# Patient Record
Sex: Male | Born: 1955 | Race: Black or African American | Hispanic: No | Marital: Single | Smoking: Former smoker
Health system: Southern US, Community
[De-identification: ages and names within clinical notes are randomized; demographics above are authoritative.]

## PROBLEM LIST (undated history)

## (undated) DIAGNOSIS — I10 Essential (primary) hypertension: Secondary | ICD-10-CM

## (undated) DIAGNOSIS — E785 Hyperlipidemia, unspecified: Secondary | ICD-10-CM

## (undated) DIAGNOSIS — J45909 Unspecified asthma, uncomplicated: Secondary | ICD-10-CM

## (undated) HISTORY — DX: Essential (primary) hypertension: I10

## (undated) HISTORY — DX: Hyperlipidemia, unspecified: E78.5

## (undated) HISTORY — DX: Unspecified asthma, uncomplicated: J45.909

## (undated) HISTORY — PX: NO PAST SURGERIES: SHX2092

---

## 2007-11-10 ENCOUNTER — Emergency Department (HOSPITAL_COMMUNITY): Admission: EM | Admit: 2007-11-10 | Discharge: 2007-11-10 | Payer: Self-pay | Admitting: Emergency Medicine

## 2008-01-20 ENCOUNTER — Ambulatory Visit: Payer: Self-pay | Admitting: Internal Medicine

## 2008-01-20 ENCOUNTER — Encounter (INDEPENDENT_AMBULATORY_CARE_PROVIDER_SITE_OTHER): Payer: Self-pay | Admitting: Family Medicine

## 2008-01-20 LAB — CONVERTED CEMR LAB
BUN: 9 mg/dL (ref 6–23)
Basophils Absolute: 0 10*3/uL (ref 0.0–0.1)
CO2: 22 meq/L (ref 19–32)
Calcium: 9.3 mg/dL (ref 8.4–10.5)
Chloride: 106 meq/L (ref 96–112)
Creatinine, Ser: 0.92 mg/dL (ref 0.40–1.50)
Eosinophils Relative: 14 % — ABNORMAL HIGH (ref 0–5)
Glucose, Bld: 80 mg/dL (ref 70–99)
HCT: 39.8 % (ref 39.0–52.0)
Hemoglobin: 13.3 g/dL (ref 13.0–17.0)
Lymphocytes Relative: 49 % — ABNORMAL HIGH (ref 12–46)
Lymphs Abs: 1.9 10*3/uL (ref 0.7–4.0)
Monocytes Absolute: 0.3 10*3/uL (ref 0.1–1.0)
Monocytes Relative: 9 % (ref 3–12)
RBC: 3.99 M/uL — ABNORMAL LOW (ref 4.22–5.81)
RDW: 13.6 % (ref 11.5–15.5)
Total Bilirubin: 0.5 mg/dL (ref 0.3–1.2)

## 2008-02-25 ENCOUNTER — Emergency Department (HOSPITAL_COMMUNITY): Admission: EM | Admit: 2008-02-25 | Discharge: 2008-02-25 | Payer: Self-pay | Admitting: Emergency Medicine

## 2008-09-05 ENCOUNTER — Ambulatory Visit: Payer: Self-pay | Admitting: Family Medicine

## 2008-09-14 ENCOUNTER — Encounter: Payer: Self-pay | Admitting: Internal Medicine

## 2008-09-14 ENCOUNTER — Ambulatory Visit (HOSPITAL_COMMUNITY): Admission: RE | Admit: 2008-09-14 | Discharge: 2008-09-14 | Payer: Self-pay | Admitting: Internal Medicine

## 2008-09-14 ENCOUNTER — Ambulatory Visit: Payer: Self-pay | Admitting: *Deleted

## 2009-11-25 IMAGING — CT CT HEAD W/O CM
1 series · 16 of 30 positions shown, 20 images · non-contrast
Comparison: None

CLINICAL DATA: Headache.  Right arm pain.

CT HEAD WITHOUT CONTRAST
TECHNIQUE: Contiguous axial images were obtained from the base of
the skull through the vertex without contrast.

[Series 2: head routine 4.8 h37s · axial · 0.43mm/px · z∈[-137,-8]mm · 16 of 30 slices shown, 20 images]
[im 2/30  brain]
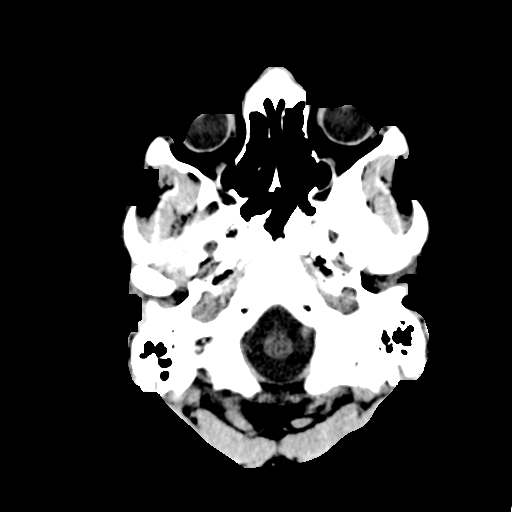
[im 2/30  bone]
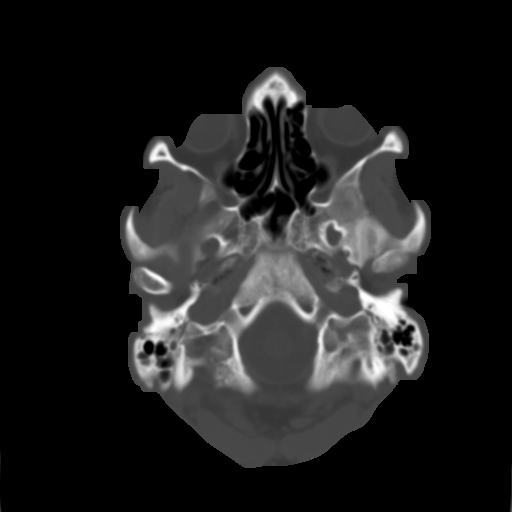
[im 4/30  brain]
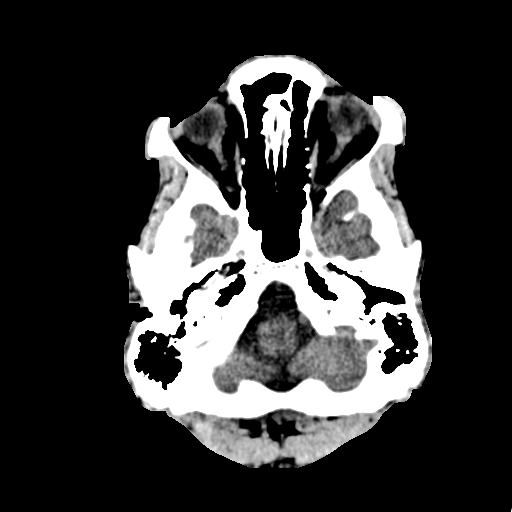
[im 6/30  brain]
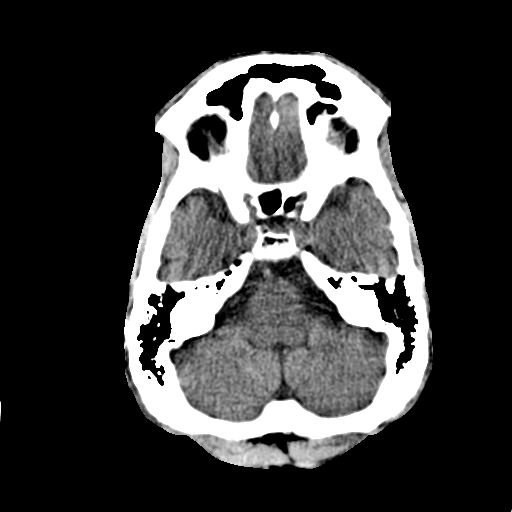
[im 8/30  brain]
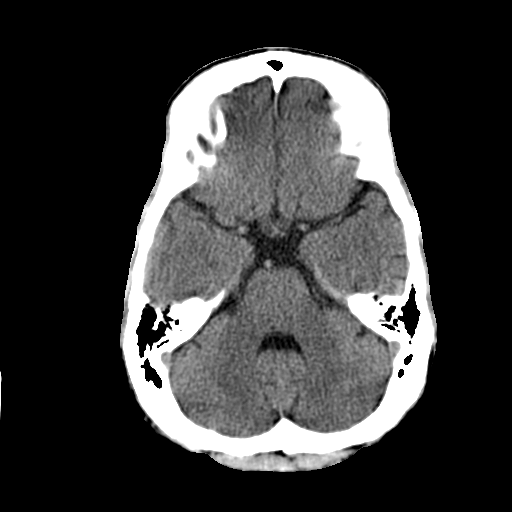
[im 9/30  brain]
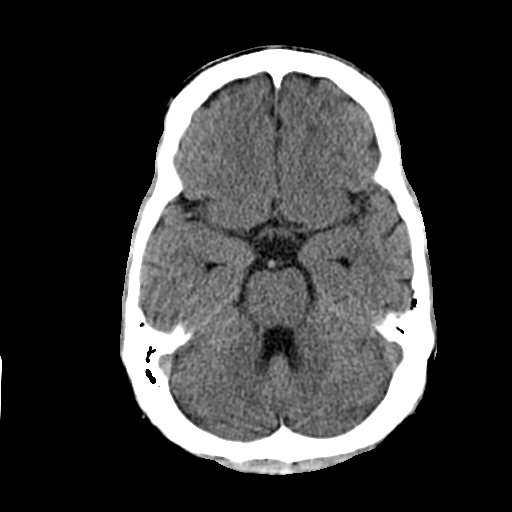
[im 9/30  bone]
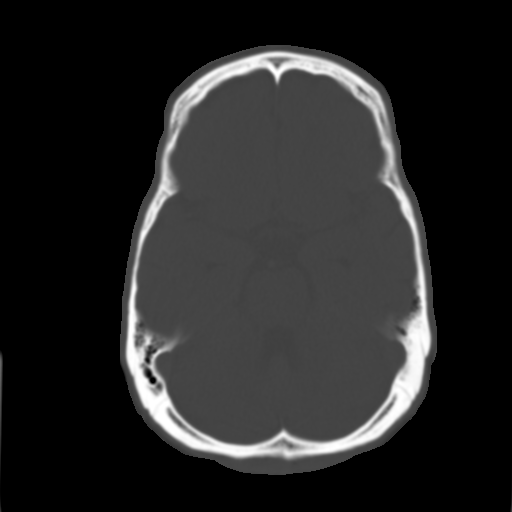
[im 11/30  brain]
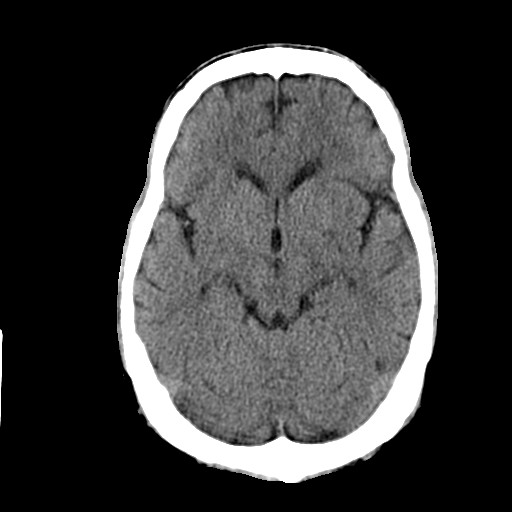
[im 13/30  brain]
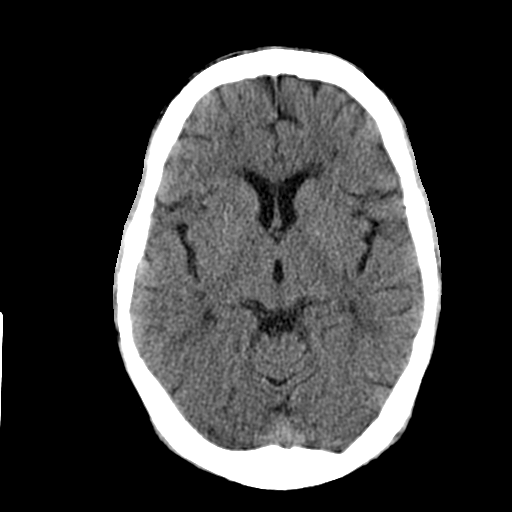
[im 15/30  brain]
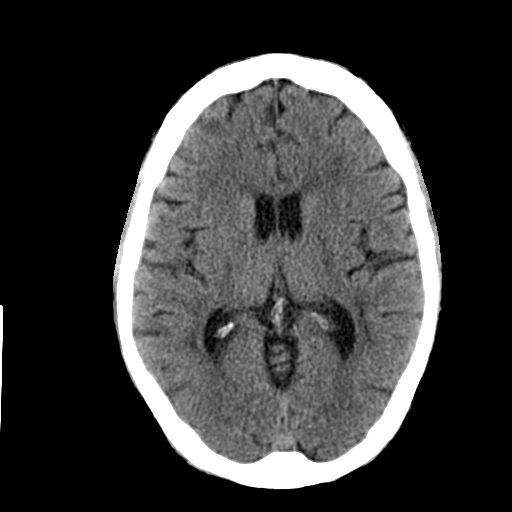
[im 16/30  brain]
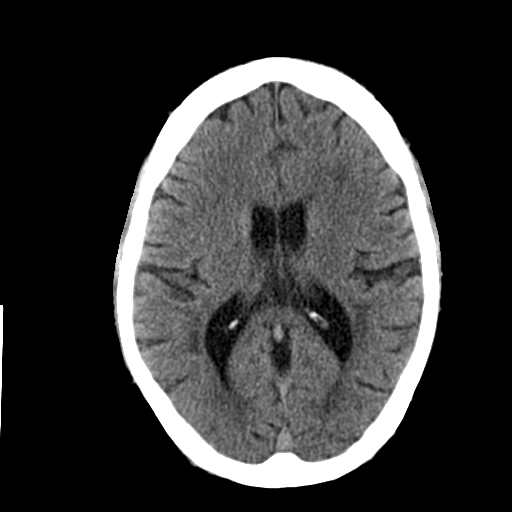
[im 16/30  bone]
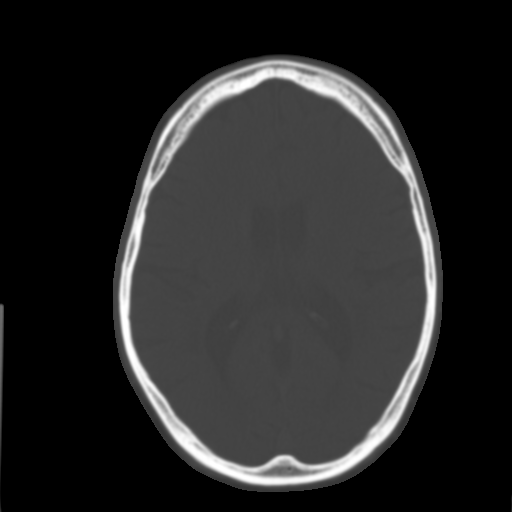
[im 18/30  brain]
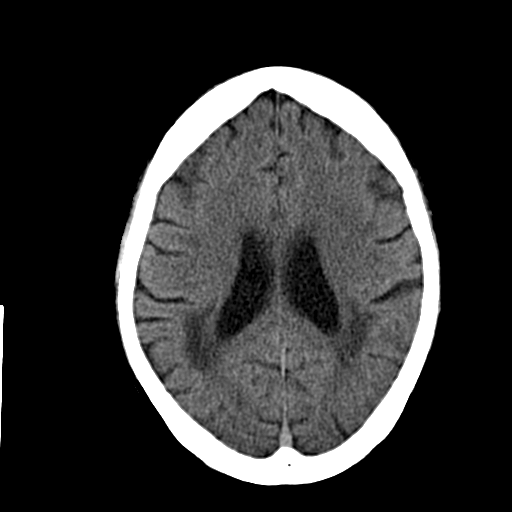
[im 20/30  brain]
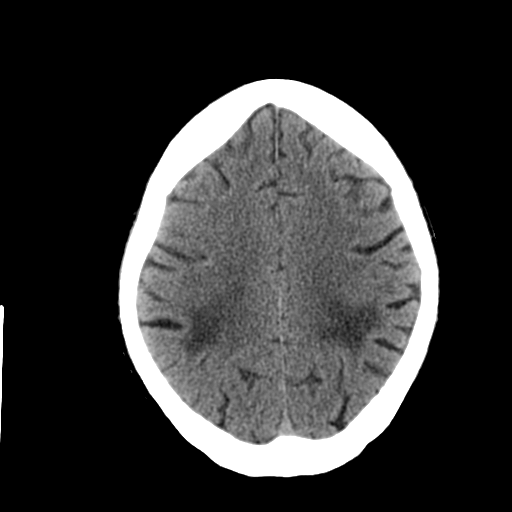
[im 22/30  brain]
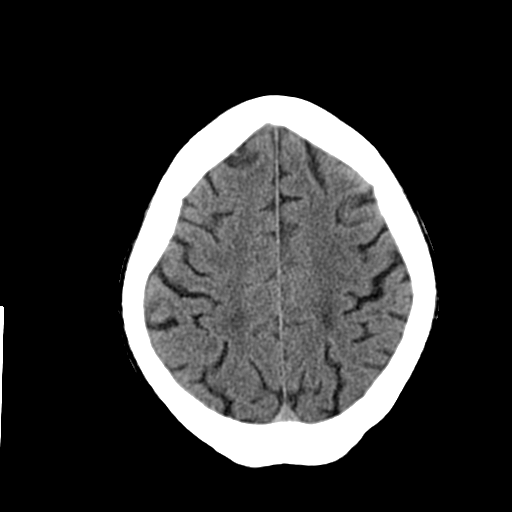
[im 23/30  brain]
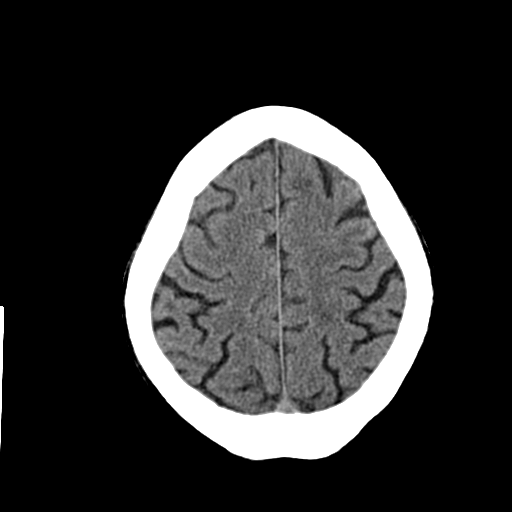
[im 23/30  bone]
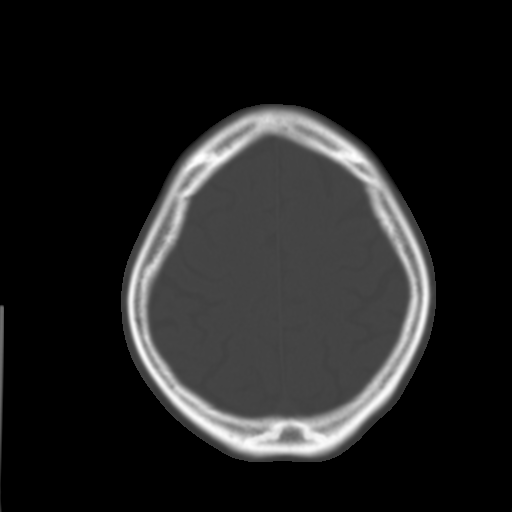
[im 25/30  brain]
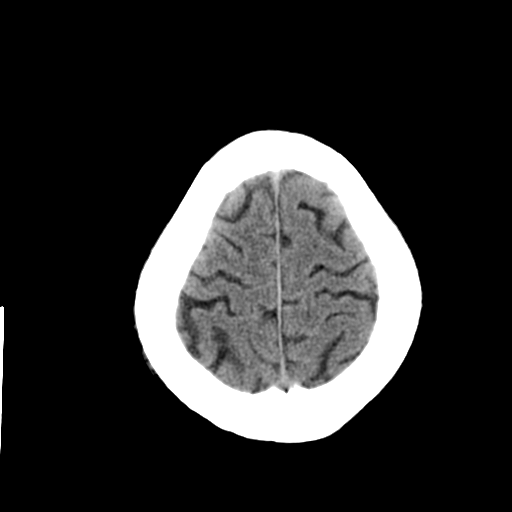
[im 27/30  brain]
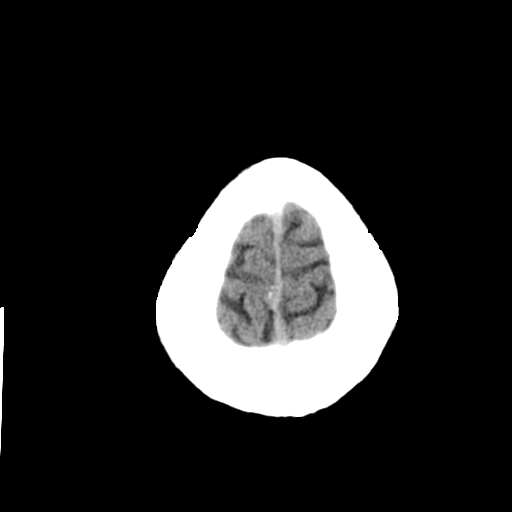
[im 29/30  brain]
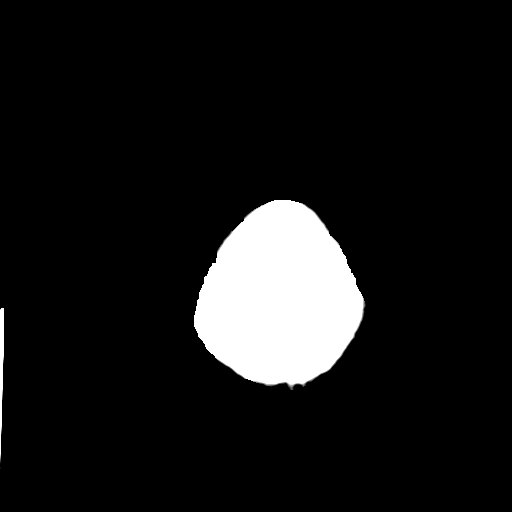

[16 of 30 positions shown; findings below may reference images not displayed]

FINDINGS: There is abnormal low density in the white matter of both
parietal lobes and to a lesser extent in the frontal lobes.  This
is presumed to represent chronic small vessel change, but is
certainly advanced for age.  No cortical abnormality is seen.  No
sign of mass lesion, hemorrhage, hydrocephalus or extra-axial
collection.  Sinuses, middle ears and mastoids are clear.
IMPRESSION: Extensive chronic small vessel changes.  No identifiably acute
insult.

## 2011-06-20 LAB — POCT CARDIAC MARKERS
CKMB, poc: 2.4
Myoglobin, poc: 59.8
Operator id: 146091
Troponin i, poc: <0.05

## 2011-06-20 LAB — POCT I-STAT, CHEM 8
Calcium, Ion: 1.23
Hemoglobin: 14.3
Sodium: 136
TCO2: 29

## 2012-06-08 ENCOUNTER — Other Ambulatory Visit (HOSPITAL_BASED_OUTPATIENT_CLINIC_OR_DEPARTMENT_OTHER): Payer: Self-pay | Admitting: Internal Medicine

## 2012-06-16 ENCOUNTER — Other Ambulatory Visit (HOSPITAL_BASED_OUTPATIENT_CLINIC_OR_DEPARTMENT_OTHER): Payer: Self-pay

## 2016-05-31 NOTE — Progress Notes (Deleted)
David Chase was seen today in neurologic consultation at the request of No primary care provider on file..  The consultation is for the evaluation of multiple syncopal episodes.  The records that were made available to me were reviewed.  The patient is a 60 y.o. year old male with a history of alcohol use/abuse, chronic pain under the care of pain management, COPD, HTN, hyperlipidemia who has had 2 syncopal episodes within the last 2 months.  With the first, the patient ***and fell down the stairs.  There was ***loss of bladder/bowel control.  There was no shaking or tongue biting.  The second episode happened about a month ago now.  ***His PCP ordered an echo which is reported to be normal in No primary care provider on file. notes but I [name]'t have the actual echo.     Neuroimaging has *** previously been performed.  It *** available for my review today.  PREVIOUS MEDICATIONS: ***  ALLERGIES:  Allergies not on file  CURRENT MEDICATIONS:  No outpatient encounter prescriptions on file as of 06/03/2016.   No facility-administered encounter medications on file as of 06/03/2016.     PAST MEDICAL HISTORY:  No past medical history on file.  PAST SURGICAL HISTORY:  No past surgical history on file.  SOCIAL HISTORY:   Social History   Social History  . Marital status: Single    Spouse name: N/A  . Number of children: N/A  . Years of education: N/A   Occupational History  . Not on file.   Social History Main Topics  . Smoking status: Not on file  . Smokeless tobacco: Not on file  . Alcohol use Not on file  . Drug use: Unknown  . Sexual activity: Not on file   Other Topics Concern  . Not on file   Social History Narrative  . No narrative on file    FAMILY HISTORY:   No family status information on file.    ROS:  A complete 10 system review of systems was obtained and was unremarkable apart from what is mentioned above.  PHYSICAL EXAMINATION:    VITALS:  There were no  vitals filed for this visit.  GEN:  Normal appears male in no acute distress.  Appears stated age. HEENT:  Normocephalic, atraumatic. The mucous membranes are moist. The superficial temporal arteries are without ropiness or tenderness. Cardiovascular: Regular rate and rhythm. Lungs: Clear to auscultation bilaterally. Neck/Heme: There are no carotid bruits noted bilaterally.  NEUROLOGICAL: Orientation:  The patient is alert and oriented x 3.  Fund of knowledge is appropriate.  Recent and remote memory intact.  Attention span and concentration normal.  Repeats and names without difficulty. Cranial nerves: There is good facial symmetry. The pupils are equal round and reactive to light bilaterally. Fundoscopic exam reveals clear disc margins bilaterally. Extraocular muscles are intact and visual fields are full to confrontational testing. Speech is fluent and clear. Soft palate rises symmetrically and there is no tongue deviation. Hearing is intact to conversational tone. Tone: Tone is good throughout. Sensation: Sensation is intact to light touch and pinprick throughout (facial, trunk, extremities). Vibration is intact at the bilateral big toe. There is no extinction with double simultaneous stimulation. There is no sensory dermatomal level identified. Coordination:  The patient has no difficulty with RAM's or FNF bilaterally. Motor: Strength is 5/5 in the bilateral upper and lower extremities.  Shoulder shrug is equal and symmetric. There is no pronator drift.  There are no fasciculations  noted. DTR's: Deep tendon reflexes are 2/4 at the bilateral biceps, triceps, brachioradialis, patella and achilles.  Plantar responses are downgoing bilaterally. Gait and Station: The patient is able to ambulate without difficulty. The patient is able to heel toe walk without any difficulty. The patient is able to ambulate in a tandem fashion. The patient is able to stand in the Romberg position.  Labs: The patient  had lab work at his primary care physician on 02/29/2016 that had the opportunity to review.  His white blood cells were 3.2, hemoglobin 11.8, hematocrit 34.2 and platelets 212.  His AST was elevated at 61 and ALT was 33.  BUN was 6 and creatinine 0.86.  His hemoglobin A1c was 5.3.  TSH was normal at 1.200.   IMPRESSION/PLAN  1.  Syncope  -Does not sound like a primary neurologic event, and no history to suggest seizure, but we will certainly do an EEG.  If that is negative, he will have an ambulatory EEG.  -We will do an MRI of the brain  -Talked about the importance of tapering and ultimately discontinuing alcohol.   -Discussed [place] driving laws as it pertains to syncope of unknown origin.  He should not be driving for at least 6 months.  Discussed seizure/syncope and safety in detail which included but was not limited to no swimming alone, no tub baths without an unlocked door and someone else home, no working at heights.  No working with heavy or dangerous equipment.  2.  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   Cc:  No primary care provider on file.

## 2016-06-03 ENCOUNTER — Ambulatory Visit: Payer: Medicaid Other | Admitting: Neurology

## 2016-07-02 NOTE — Progress Notes (Signed)
David Chase was seen today in neurologic consultation at the request of OSEI-BONSU,GEORGE, MD.  The consultation is for the evaluation of multiple syncopal episodes.  The records that were made available to me were reviewed.  The patient is a 60 y.o. year old male with a history of alcohol use/abuse, chronic pain under the care of pain management, COPD, HTN, hyperlipidemia who has had 2 syncopal episodes within the last 2 months. Pt states that there may have 3.   With the first, the patient was walking down the stairs and had LOC and fell down the stairs.  No warning.  No palpitations.  No idea how long he was out but GF with him.  Doesn't know if lost bladder/bowel control There was no shaking or tongue biting.  The second episode happened about a month ago now.  He got up and walked in the hallway and fell and hit the floor.  He did have LOC. He had another similar episode about a week ago.  He cannot provide any details.   His PCP ordered an echo which is reported to be normal in PCP notes but I Rusell't have the actual echo.  No new meds/OTC cold meds.     Neuroimaging has previously been performed.  I have the report but not films.  He had a CT on 06/11/16 and it was nonacute and demonstrated mild atrophy.  PREVIOUS MEDICATIONS: n/a  ALLERGIES:  No Known Allergies  CURRENT MEDICATIONS:  Outpatient Encounter Prescriptions as of 07/04/2016  Medication Sig  . aspirin EC 325 MG tablet Take 325 mg by mouth daily.  . cyclobenzaprine (FLEXERIL) 5 MG tablet TAKE 1 TABLET BY MOUTH TWICE A DAY AS FOR 15 DAYS  . gabapentin (NEURONTIN) 300 MG capsule Take 300 mg by mouth 3 (three) times daily.  Marland Kitchen. PROAIR HFA 108 (90 Base) MCG/ACT inhaler USE 2 PUFFS 4 TIMES A DAY   No facility-administered encounter medications on file as of 07/04/2016.     PAST MEDICAL HISTORY:   Past Medical History:  Diagnosis Date  . Asthma   . Hyperlipidemia   . Hypertension     PAST SURGICAL HISTORY:   Past Surgical  History:  Procedure Laterality Date  . NO PAST SURGERIES      SOCIAL HISTORY:   Social History   Social History  . Marital status: Single    Spouse name: N/A  . Number of children: N/A  . Years of education: N/A   Occupational History  . 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 Smoking status: Former Smoker    Quit date: 07/04/2014  . Smokeless tobacco: Never Used  . Alcohol use Yes     Comment: "beers all day"; 2 -  40 oz of liquor  . Drug use:     Types: Marijuana  . Sexual activity: Not on file   Other Topics Concern  . Not on file   Social History Narrative  . No narrative on file    FAMILY HISTORY:   Family Status  Relation Status  . Mother Deceased  . Father Deceased  . Sister Alive  . Brother Alive  . Brother Deceased   one head trauma after fall; one "blacked out"    ROS:  Intermittent CP/SOB.  A complete 10 system review of systems was obtained and was unremarkable apart from what is mentioned above.  PHYSICAL EXAMINATION:    VITALS:   Vitals:  07/04/16 0926  BP: (!) 130/94  Pulse: 60  Weight: 176 lb (79.8 kg)  Height: 5\' 9"  (1.753 m)    GEN:  Normal appears male in no acute distress.  Appears stated age. HEENT:  Normocephalic, atraumatic. The mucous membranes are moist. The superficial temporal arteries are without ropiness or tenderness. Cardiovascular: Regular rate and rhythm. Lungs: Clear to auscultation bilaterally. Neck/Heme: There are no carotid bruits noted bilaterally.  NEUROLOGICAL: Orientation:  The patient is alert and oriented x 3.  Fund of knowledge is appropriate.  Recent and remote memory intact.  Attention span and concentration normal.  Repeats and names without difficulty. Cranial nerves: There is good facial symmetry. The pupils are equal round and reactive to light bilaterally. Fundoscopic exam reveals clear disc margins bilaterally. Extraocular muscles are intact and visual fields are full to  confrontational testing. Speech is fluent and clear. Soft palate rises symmetrically and there is no tongue deviation. Hearing is intact to conversational tone. Tone: Tone is good throughout. Sensation: Sensation is intact to light touch and pinprick throughout (facial, trunk, extremities). Vibration is intact at the bilateral big toe but slightly decreased. There is no extinction with double simultaneous stimulation. There is no sensory dermatomal level identified. Coordination:  The patient has no difficulty with RAM's or FNF bilaterally. Motor: Strength is 5/5 in the bilateral upper and lower extremities.  Shoulder shrug is equal and symmetric. There is no pronator drift.  There are no fasciculations noted. DTR's: Deep tendon reflexes are 3-3+/4 at the bilateral biceps, triceps, brachioradialis, patella and achilles.  Plantar responses are downgoing bilaterally. Gait and Station: The patient is able to ambulate without difficulty. The patient has trouble ambulating in a tandem fashion. The patient is able to heel/toe walk. The patient is able to stand in the Romberg position.  Labs: The patient had lab work at his primary care physician on 02/29/2016 that had the opportunity to review.  His white blood cells were 3.2, hemoglobin 11.8, hematocrit 34.2 and platelets 212.  His AST was elevated at 61 and ALT was 33.  BUN was 6 and creatinine 0.86.  His hemoglobin A1c was 5.3.  TSH was normal at 1.200.   IMPRESSION/PLAN  1.  Syncope  -Does not sound like a primary neurologic event, and no history to suggest seizure, but we will certainly do an EEG.    -We will do an MRI of the brain.  I'm sure that we will see small vessel disease but want to make sure that nothing else going on.  He isnt taking his BP or cholesterol meds because he ran out.  Has appt with PCP next week  -going to do MRI of the cervical spine as well due to the fact that he is very hyperreflexic and has had neck pain after a fall.     -Talked about the importance of tapering and ultimately discontinuing alcohol.  Suspect that these events could be related to blood alcohol level   -Discussed [place] driving laws as it pertains to syncope of unknown origin.  He should not be driving for at least 6 months.  Discussed seizure/syncope and safety in detail which included but was not limited to no swimming alone, no tub baths without an unlocked door and someone else home, no working at heights.  No working with heavy or dangerous equipment.  2.  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  3.   F/u will depend on results of above.  Cc:  Jackie Plum, MD

## 2016-07-04 ENCOUNTER — Encounter: Payer: Self-pay | Admitting: Neurology

## 2016-07-04 ENCOUNTER — Ambulatory Visit (INDEPENDENT_AMBULATORY_CARE_PROVIDER_SITE_OTHER): Payer: Medicaid Other | Admitting: Neurology

## 2016-07-04 VITALS — BP 130/94 | HR 60 | Ht 69.0 in | Wt 176.0 lb

## 2016-07-04 DIAGNOSIS — F1010 Alcohol abuse, uncomplicated: Secondary | ICD-10-CM

## 2016-07-04 DIAGNOSIS — M542 Cervicalgia: Secondary | ICD-10-CM | POA: Diagnosis not present

## 2016-07-04 DIAGNOSIS — R292 Abnormal reflex: Secondary | ICD-10-CM

## 2016-07-04 DIAGNOSIS — R55 Syncope and collapse: Secondary | ICD-10-CM

## 2016-07-04 NOTE — Patient Instructions (Signed)
1. We will schedule EEG.   2. [facility] and they will call you directly to schedule your appt. They are located at [address] If you need to contact them directly please call [phone].

## 2016-07-10 ENCOUNTER — Other Ambulatory Visit: Payer: Medicaid Other

## 2016-07-15 ENCOUNTER — Telehealth: Payer: Self-pay | Admitting: Neurology

## 2016-07-15 NOTE — Telephone Encounter (Signed)
Tried to call patient again with no answer 

## 2016-07-15 NOTE — Telephone Encounter (Signed)
Tried to call patient with no answer and no way to leave message. Will try again later.  

## 2016-07-15 NOTE — Telephone Encounter (Signed)
[facility] denied MRI brain and cervical spine.  Said that same test or one similar done previously.  Not sure what they are referring to unless referring to CT brain?  Anyway, I Estiben't see option at all to appeal (it was a hard denial).  It does look like the patient can appeal.  Let pt know that I recommend he do this.

## 2016-07-17 ENCOUNTER — Ambulatory Visit (INDEPENDENT_AMBULATORY_CARE_PROVIDER_SITE_OTHER): Payer: Medicaid Other | Admitting: Neurology

## 2016-07-17 DIAGNOSIS — R55 Syncope and collapse: Secondary | ICD-10-CM | POA: Diagnosis not present

## 2016-07-18 NOTE — Procedures (Signed)
TECHNICAL SUMMARY:  A multichannel referential and bipolar montage EEG using the standard international 10-20 system was performed on the patient described as awake, drowsy and sleep.  The dominant background activity consists of 9 hertz activity seen most prominantly over the posterior head region.  The backgound activity is reactive to eye opening and closing procedures.  Low voltage fast (beta) activity is distributed symmetrically and maximally over the anterior head regions.  ACTIVATION:  Stepwise photic stimulation at 4-20 flashes per second was performed and did not elicit any abnormal waveforms.  Hyperventilation was performed.  EPILEPTIFORM ACTIVITY:  There were no spikes, sharp waves or paroxysmal activity.  SLEEP:  Stage I and 2 sleep architecture was identified.  CARDIAC:  The EKG lead revealed a regular sinus rhythm.  IMPRESSION:  This is a normal EEG for the patients stated age.  There were no focal, hemispheric or lateralizing features.  No epileptiform activity was recorded.  A normal EEG does not exclude the diagnosis of a seizure disorder and if seizure remains high on the list of differential diagnosis, an ambulatory EEG may be of value.  Clinical correlation is required.

## 2017-07-30 ENCOUNTER — Encounter: Payer: Self-pay | Admitting: Hematology & Oncology

## 2017-08-21 ENCOUNTER — Other Ambulatory Visit (HOSPITAL_BASED_OUTPATIENT_CLINIC_OR_DEPARTMENT_OTHER): Payer: Medicaid Other

## 2017-08-21 ENCOUNTER — Encounter: Payer: Self-pay | Admitting: Family

## 2017-08-21 ENCOUNTER — Ambulatory Visit (HOSPITAL_BASED_OUTPATIENT_CLINIC_OR_DEPARTMENT_OTHER): Payer: Medicaid Other | Admitting: Family

## 2017-08-21 ENCOUNTER — Other Ambulatory Visit: Payer: Self-pay | Admitting: Family

## 2017-08-21 ENCOUNTER — Other Ambulatory Visit: Payer: Self-pay

## 2017-08-21 VITALS — BP 115/80 | HR 73 | Temp 97.7°F | Resp 19 | Wt 171.0 lb

## 2017-08-21 DIAGNOSIS — D72819 Decreased white blood cell count, unspecified: Secondary | ICD-10-CM

## 2017-08-21 DIAGNOSIS — D649 Anemia, unspecified: Secondary | ICD-10-CM

## 2017-08-21 LAB — CBC WITH DIFFERENTIAL (CANCER CENTER ONLY)
BASO#: 0.1 10*3/uL (ref 0.0–0.2)
BASO%: 1.1 % (ref 0.0–2.0)
EOS%: 8.4 % — AB (ref 0.0–7.0)
Eosinophils Absolute: 0.4 10*3/uL (ref 0.0–0.5)
HEMATOCRIT: 34.9 % — AB (ref 38.7–49.9)
HEMOGLOBIN: 12.6 g/dL — AB (ref 13.0–17.1)
LYMPH#: 1.9 10*3/uL (ref 0.9–3.3)
LYMPH%: 42.6 % (ref 14.0–48.0)
MCH: 35 pg — ABNORMAL HIGH (ref 28.0–33.4)
MCHC: 36.1 g/dL — ABNORMAL HIGH (ref 32.0–35.9)
MCV: 97 fL (ref 82–98)
MONO#: 0.7 10*3/uL (ref 0.1–0.9)
MONO%: 16.8 % — AB (ref 0.0–13.0)
NEUT%: 31.1 % — ABNORMAL LOW (ref 40.0–80.0)
NEUTROS ABS: 1.4 10*3/uL — AB (ref 1.5–6.5)
Platelets: 212 10*3/uL (ref 145–400)
RBC: 3.6 10*6/uL — AB (ref 4.20–5.70)
RDW: 11.2 % (ref 11.1–15.7)
WBC: 4.4 10*3/uL (ref 4.0–10.0)

## 2017-08-21 LAB — CMP (CANCER CENTER ONLY)
ALBUMIN: 4.2 g/dL (ref 3.3–5.5)
ALK PHOS: 55 U/L (ref 26–84)
ALT: 22 U/L (ref 10–47)
AST: 35 U/L (ref 11–38)
BILIRUBIN TOTAL: 0.6 mg/dL (ref 0.20–1.60)
BUN, Bld: 11 mg/dL (ref 7–22)
CALCIUM: 10 mg/dL (ref 8.0–10.3)
CO2: 26 meq/L (ref 18–33)
CREATININE: 1.1 mg/dL (ref 0.6–1.2)
Chloride: 94 mEq/L — ABNORMAL LOW (ref 98–108)
Glucose, Bld: 86 mg/dL (ref 73–118)
Potassium: 4.4 mEq/L (ref 3.3–4.7)
SODIUM: 134 meq/L (ref 128–145)
TOTAL PROTEIN: 8.4 g/dL — AB (ref 6.4–8.1)

## 2017-08-21 LAB — LACTATE DEHYDROGENASE: LDH: 189 U/L (ref 125–245)

## 2017-08-21 LAB — CHCC SATELLITE - SMEAR

## 2017-08-21 NOTE — Progress Notes (Addendum)
Hematology/Oncology Consultation   Name: Joanna PuffDon Rueter      MRN: [id number]    Location: Room/bed info not found  Date: 08/21/2017 Time:2:31 PM   REFERRING PHYSICIAN: Jackie PlumGeorge Osei-Bonsu, MD  REASON FOR CONSULT: Leukopenia and anemia    DIAGNOSIS:  Leukopenia  Anemia  HISTORY OF PRESENT ILLNESS: Mr. Corliss BlackerMcNeill is a very pleasant is a very pleasant 61 yo African American gentle man with recent lab work reflecting WBC count of 3.1 and Hgb 12.7. His WBC count today is 4.4 and Hgb 12.6 with an MCV of 97, platelets 212.  His iron studies and B 12 level 2 weeks ago were good.  He has had no episodes of bleeding, bruising or petechiae. No lymphadenopathy found on exam.  He denies having an issue with frequent infections. He has SOB with over exertion due to COPD.  He denies any personal or familial history of anemia, sickle cell disease or trait or cancer.  He has a history of alcohol and drug abuse. He states that he has stopped the cocaine and marijuana use due to his COPD. He is is still drinking beer "throughout the day".  He states that he has had episodes in the past where had has gotten dizzy and passed out.  No fever, chills, n/v, cough, rash, chest pain, palpitations, abdominal pain or changes in bowel or bladder habits.  No swelling or tenderness in his extremities. He states that he has neuropathy in his hands and feet. He takes Neurontin for pain in his shoulders.  He uses a [employer] as a cane and has had no falls recently.  He has a good appetite and is staying well hydrated. He states that he is borderline diabetic. His weight is stable.   ROS: All other 10 point review of systems is negative.   PAST MEDICAL HISTORY:   Past Medical History:  Diagnosis Date  . Asthma   . Hyperlipidemia   . Hypertension     ALLERGIES: No Known Allergies    MEDICATIONS:  Current Outpatient Medications on File Prior to Visit  Medication Sig Dispense Refill  . aspirin EC 325 MG tablet Take 325 mg  by mouth daily.  2  . cyclobenzaprine (FLEXERIL) 5 MG tablet TAKE 1 TABLET BY MOUTH TWICE A DAY AS FOR 15 DAYS  0  . gabapentin (NEURONTIN) 300 MG capsule Take 300 mg by mouth 3 (three) times daily.  0  . PROAIR HFA 108 (90 Base) MCG/ACT inhaler USE 2 PUFFS 4 TIMES A DAY  2   No current facility-administered medications on file prior to visit.      PAST SURGICAL HISTORY Past Surgical History:  Procedure Laterality Date  . NO PAST SURGERIES      FAMILY HISTORY: Family History  Problem Relation Age of Onset  . Stroke Mother     SOCIAL HISTORY:  reports that he quit smoking about 3 years ago. he has never used smokeless tobacco. He reports that he drinks alcohol. He reports that he uses drugs. Drug: Marijuana.  PERFORMANCE STATUS: The patient's performance status is 0 - Asymptomatic  PHYSICAL EXAM: Most Recent Vital Signs: There were no vitals taken for this visit. BP 115/80 (BP Location: Left Arm, Patient Position: Sitting)   Pulse 73   Temp 97.7 F (36.5 C) (Oral)   Resp 19   Wt 171 lb (77.6 kg)   SpO2 99%   BMI 25.25 kg/m   General Appearance:    Alert, cooperative, no distress, appears stated age  Head:    Normocephalic, without obvious abnormality, atraumatic  Eyes:    PERRL, conjunctiva/corneas clear, EOM's intact, fundi    benign, both eyes             Throat:   Lips, mucosa, and tongue normal; teeth and gums normal  Neck:   Supple, symmetrical, trachea midline, no adenopathy;       thyroid:  No enlargement/tenderness/nodules; no carotid   bruit or JVD  Back:     Symmetric, no curvature, ROM normal, no CVA tenderness  Lungs:     Clear to auscultation bilaterally, respirations unlabored  Chest wall:    No tenderness or deformity  Heart:    Regular rate and rhythm, S1 and S2 normal, no murmur, rub   or gallop  Abdomen:     Soft, non-tender, bowel sounds active all four quadrants,    no masses, no organomegaly        Extremities:   Extremities normal,  atraumatic, no cyanosis or edema  Pulses:   2+ and symmetric all extremities  Skin:   Skin color, texture, turgor normal, no rashes or lesions  Lymph nodes:   Cervical, supraclavicular, and axillary nodes normal  Neurologic:   CNII-XII intact. Normal strength, sensation and reflexes      throughout    LABORATORY DATA:  Results for orders placed or performed in visit on 08/21/17 (from the past 48 hour(s))  CBC w/Diff     Status: Abnormal   Collection Time: 08/21/17  1:41 PM  Result Value Ref Range   WBC 4.4 4.0 - 10.0 10e3/uL   RBC 3.60 (L) 4.20 - 5.70 10e6/uL   HGB 12.6 (L) 13.0 - 17.1 g/dL   HCT 40.934.9 (L) 81.138.7 - 91.449.9 %   MCV 97 82 - 98 fL   MCH 35.0 (H) 28.0 - 33.4 pg   MCHC 36.1 (H) 32.0 - 35.9 g/dL   RDW 78.211.2 95.611.1 - 21.315.7 %   Platelets 212 145 - 400 10e3/uL   NEUT# 1.4 (L) 1.5 - 6.5 10e3/uL   LYMPH# 1.9 0.9 - 3.3 10e3/uL   MONO# 0.7 0.1 - 0.9 10e3/uL   Eosinophils Absolute 0.4 0.0 - 0.5 10e3/uL   BASO# 0.1 0.0 - 0.2 10e3/uL   NEUT% 31.1 (L) 40.0 - 80.0 %   LYMPH% 42.6 14.0 - 48.0 %   MONO% 16.8 (H) 0.0 - 13.0 %   EOS% 8.4 (H) 0.0 - 7.0 %   BASO% 1.1 0.0 - 2.0 %  CMP STAT     Status: Abnormal   Collection Time: 08/21/17  1:41 PM  Result Value Ref Range   Sodium 134 128 - 145 mEq/L   Potassium 4.4 3.3 - 4.7 mEq/L   Chloride 94 (L) 98 - 108 mEq/L   CO2 26 18 - 33 mEq/L   Glucose, Bld 86 73 - 118 mg/dL   BUN, Bld 11 7 - 22 mg/dL   Creat 1.1 0.6 - 1.2 mg/dl   Total Bilirubin 0.860.60 0.20 - 1.60 mg/dl   Alkaline Phosphatase 55 26 - 84 U/L   AST 35 11 - 38 U/L   ALT(SGPT) 22 10 - 47 U/L   Total Protein 8.4 (H) 6.4 - 8.1 g/dL   Albumin 4.2 3.3 - 5.5 g/dL   Calcium 57.810.0 8.0 - 46.910.3 mg/dL  Smear     Status: None   Collection Time: 08/21/17  1:41 PM  Result Value Ref Range   Smear Result Smear Available  RADIOGRAPHY: No results found.     PATHOLOGY: None   ASSESSMENT/PLAN: Mr. Sternberg is a very pleasant is a very pleasant 61 yo African American gentle man with  recent low WBC and anemia. His WBC count today is 4.4 and Hgb 12.6 with an MCV of 97, platelets 212. He is asymptomatic and has no complaints at this time.  Dr. Myna Hidalgo was able to view his smear and did not note any abnormality or evidence of malignancy.  We discussed the negative effects that recreational drugs and alcohol would have on his blood counts. He verbalized understanding.  We will not need to see him back in our office for follow-up at this point. All questions were answered and he is in agreement with the plan. He can certainly contact our office with any future heme/onc problems, questions or concerns.  He was discussed with and also seen by Dr. Myna Hidalgo and he is in agreement with the aforementioned.   [facility]     Addendum: I saw and examined the patient with Miarose Lippert.  I agree with the above assessment.  I looked at his smear under the microscope.  I really do not see anything that looked suspicious.  He had good maturity of his white blood cells.  There were no immature myeloid or lymphoid cells.  There is no rouleaux formation.  He had no target cells.  There was no inclusion bodies.  Platelets were adequate in number and size.  His blood counts really looked okay.  Not sure there is any hematologic issue that we have to deal with.  He may have an element of ethnic associated leukopenia.  If so, this would not put him at any higher risk for infection or other hematologic conditions.  It is possible that his use of recreational drugs and alcohol could be affecting his blood counts.  I counseled him on using cocaine.  I told him that using cocaine and alcohol could poison his bone marrow and could permanently because of low blood counts that would increase his risk of infection and bleeding.  He understands quite well the risk of drinking alcohol and using recreational drugs.  As nice as he is, I just do not think we have to see him back in the office.  I just do not  think that we would be making an impact on his medical care.  He spent about 1/2-hour with him.  We reviewed his lab work with him.  We answered his questions.  Christin Bach, MD

## 2017-08-22 LAB — ERYTHROPOIETIN: ERYTHROPOIETIN: 7.2 m[IU]/mL (ref 2.6–18.5)

## 2017-08-22 LAB — RETICULOCYTES: RETICULOCYTE COUNT: 1.8 % (ref 0.6–2.6)

## 2017-12-09 ENCOUNTER — Encounter: Payer: Self-pay | Admitting: Podiatry

## 2017-12-09 ENCOUNTER — Ambulatory Visit (INDEPENDENT_AMBULATORY_CARE_PROVIDER_SITE_OTHER): Payer: Medicaid Other | Admitting: Podiatry

## 2017-12-09 DIAGNOSIS — M21612 Bunion of left foot: Secondary | ICD-10-CM | POA: Diagnosis not present

## 2017-12-09 DIAGNOSIS — M21611 Bunion of right foot: Secondary | ICD-10-CM

## 2017-12-09 DIAGNOSIS — M21619 Bunion of unspecified foot: Secondary | ICD-10-CM

## 2017-12-09 DIAGNOSIS — M7742 Metatarsalgia, left foot: Secondary | ICD-10-CM

## 2017-12-09 DIAGNOSIS — M2012 Hallux valgus (acquired), left foot: Secondary | ICD-10-CM

## 2017-12-09 DIAGNOSIS — M7741 Metatarsalgia, right foot: Secondary | ICD-10-CM | POA: Diagnosis not present

## 2017-12-09 DIAGNOSIS — M2011 Hallux valgus (acquired), right foot: Secondary | ICD-10-CM | POA: Diagnosis not present

## 2017-12-09 NOTE — Patient Instructions (Signed)
Seen for painful feet. Noted of severe bunion deformities with overlapping toes. All nails debrided. Return if need to have surgical correction of the deformed toes.

## 2017-12-09 NOTE — Progress Notes (Signed)
SUBJECTIVE: 62 y.o. year old male presents with painful feet. Pain is on ball of foot and on top when in closed in shoes. Taking Gabapentin for shoulder pain.  Patient is not employed.  Review of Systems  Constitutional: Negative.   HENT: Negative.   Eyes: Negative.   Respiratory: Negative.   Gastrointestinal: Negative.   Genitourinary: Positive for dysuria.       Under medical management for difficulty with urination.  Musculoskeletal:       Shoulder and back pain.  Skin: Negative.      OBJECTIVE: DERMATOLOGIC EXAMINATION: Nails: Hypertrophic nails x 10. No open lesions noted.  VASCULAR EXAMINATION OF LOWER LIMBS: All pedal pulses are palpable with normal pulsation.  Severe varicose vein left lower limb.  Capillary Filling times within 3 seconds in all digits.  No ischemic changes. Temperature gradient from tibial crest to dorsum of foot is within normal bilateral.  NEUROLOGIC EXAMINATION OF THE LOWER LIMBS: Achilles DTR is present and within normal. Monofilament (Semmes-Weinstein 10-gm) sensory testing positive 6 out of 6, bilateral. Vibratory sensations(128Hz  turning fork) intact at medial and lateral forefoot bilateral.  Sharp and Dull discriminatory sensations at the plantar ball of hallux is intact bilateral.   MUSCULOSKELETAL EXAMINATION: Positive for severe hallux valgus with bunion deformity bilateral. Overlapping first and 2nd toe bilateral.  ASSESSMENT: Metatarsalgia 2nd MPJ bilateral. Severe HAV with bunion bilateral. Overlapping 1st and 2nd toes bilateral. Severe Varicose vein left lower limb. Hypertrophic nails x 10.  PLAN: Reviewed findings and available treatment options. All nails debrided. Patient is to return if he wishes to have the right foot deformities corrected.

## 2018-01-07 ENCOUNTER — Other Ambulatory Visit: Payer: Self-pay

## 2018-01-07 DIAGNOSIS — I83812 Varicose veins of left lower extremities with pain: Secondary | ICD-10-CM

## 2018-01-22 ENCOUNTER — Ambulatory Visit: Payer: Medicaid Other | Admitting: Podiatry

## 2018-03-12 ENCOUNTER — Encounter: Payer: Medicaid Other | Admitting: Vascular Surgery

## 2018-03-12 ENCOUNTER — Inpatient Hospital Stay (HOSPITAL_COMMUNITY): Admission: RE | Admit: 2018-03-12 | Payer: Medicaid Other | Source: Ambulatory Visit

## 2018-05-20 ENCOUNTER — Ambulatory Visit (HOSPITAL_COMMUNITY): Admission: RE | Admit: 2018-05-20 | Payer: Medicaid Other | Source: Ambulatory Visit

## 2018-05-20 ENCOUNTER — Encounter: Payer: Medicaid Other | Admitting: Vascular Surgery

## 2018-05-20 ENCOUNTER — Encounter: Payer: Self-pay | Admitting: Vascular Surgery

## 2018-05-21 ENCOUNTER — Encounter: Payer: Medicaid Other | Admitting: Vascular Surgery

## 2018-05-21 ENCOUNTER — Encounter (HOSPITAL_COMMUNITY): Payer: Medicaid Other

## 2023-01-16 ENCOUNTER — Emergency Department (HOSPITAL_BASED_OUTPATIENT_CLINIC_OR_DEPARTMENT_OTHER): Payer: 59

## 2023-01-16 ENCOUNTER — Encounter (HOSPITAL_BASED_OUTPATIENT_CLINIC_OR_DEPARTMENT_OTHER): Payer: Self-pay

## 2023-01-16 ENCOUNTER — Other Ambulatory Visit: Payer: Self-pay

## 2023-01-16 ENCOUNTER — Emergency Department (HOSPITAL_BASED_OUTPATIENT_CLINIC_OR_DEPARTMENT_OTHER)
Admission: EM | Admit: 2023-01-16 | Discharge: 2023-01-16 | Disposition: A | Discharge status: Home / Self Care | Payer: 59 | Attending: Emergency Medicine | Admitting: Emergency Medicine

## 2023-01-16 DIAGNOSIS — Z7982 Long term (current) use of aspirin: Secondary | ICD-10-CM | POA: Insufficient documentation

## 2023-01-16 DIAGNOSIS — Y92009 Unspecified place in unspecified non-institutional (private) residence as the place of occurrence of the external cause: Secondary | ICD-10-CM | POA: Insufficient documentation

## 2023-01-16 DIAGNOSIS — S2242XA Multiple fractures of ribs, left side, initial encounter for closed fracture: Secondary | ICD-10-CM | POA: Diagnosis not present

## 2023-01-16 DIAGNOSIS — W010XXA Fall on same level from slipping, tripping and stumbling without subsequent striking against object, initial encounter: Secondary | ICD-10-CM | POA: Diagnosis not present

## 2023-01-16 DIAGNOSIS — W19XXXA Unspecified fall, initial encounter: Secondary | ICD-10-CM

## 2023-01-16 DIAGNOSIS — R0789 Other chest pain: Secondary | ICD-10-CM | POA: Diagnosis present

## 2023-01-16 MED ORDER — OXYCODONE-ACETAMINOPHEN 5-325 MG PO TABS
2.0000 | ORAL_TABLET | Freq: Once | ORAL | Status: AC
  Administered 2023-01-16: 2 via ORAL
  Filled 2023-01-16: qty 2

## 2023-01-16 MED ORDER — LIDOCAINE 5 % EX PTCH
1.0000 | MEDICATED_PATCH | CUTANEOUS | Status: DC
  Administered 2023-01-16: 1 via TRANSDERMAL
  Filled 2023-01-16: qty 1

## 2023-01-16 MED ORDER — OXYCODONE-ACETAMINOPHEN 5-325 MG PO TABS: 1.0000 | ORAL_TABLET | Freq: Four times a day (QID) | ORAL | 0 refills | Status: AC | PRN

## 2023-01-16 MED ORDER — LIDOCAINE 5 % EX PTCH: 1.0000 | MEDICATED_PATCH | CUTANEOUS | 0 refills | Status: AC

## 2023-01-16 NOTE — Discharge Instructions (Addendum)
I provided you some narcotic pain medication that you can take for breakthrough pain.  Have also given you lidocaine patches you can place over the area every 12 hours as needed for pain.  Otherwise I would like for you to take 600 mg of ibuprofen every 6 hours as needed for pain.  I would like for you to follow-up with your primary care doctor sometime early next week to ensure that you are progressing.  I would like for you to use the incentive spirometer 3-4 times per day to help prevent complications from the rib fractures like pneumonia.  You may return to the emergency room if any worsening symptoms.

## 2023-01-16 NOTE — ED Triage Notes (Signed)
Pt reports that he was in the bathroom this morning lost his balance and fell. States that he didn't hit his head. States that he hit his side and back on the tub.

## 2023-01-16 NOTE — ED Provider Notes (Signed)
[facility] Provider Note   CSN: [id number] Arrival date & time: 01/16/23  1409     History Chief Complaint  Patient presents with   Marletta Lor    David Chase is a 67 y.o. male patient who presents to the emergency department today for further evaluation of a mechanical trip and fall that occurred at home while going to bathroom.  Patient states that he was using the restroom and upon turning he lost his balance and fell against the tub.  He has had pain localized to the left lateral chest wall since then.  He denies hitting his head or losing consciousness.  Denies any other injury.   Fall       Home Medications Prior to Admission medications   Medication Sig Start Date End Date Taking? Authorizing Provider  lidocaine (LIDODERM) 5 % Place 1 patch onto the skin daily. Remove & Discard patch within 12 hours or as directed by MD 01/16/23  Yes Teressa Lower, PA-C  oxyCODONE-acetaminophen (PERCOCET/ROXICET) 5-325 MG tablet Take 1-2 tablets by mouth every 6 (six) hours as needed for severe pain. 01/16/23  Yes Honor Loh M, PA-C  aspirin EC 325 MG tablet Take 325 mg by mouth daily. 06/07/16   [provider]  cyclobenzaprine (FLEXERIL) 5 MG tablet TAKE 1 TABLET BY MOUTH TWICE A DAY AS FOR 15 DAYS 05/09/16   [provider]  gabapentin (NEURONTIN) 300 MG capsule Take 300 mg by mouth 3 (three) times daily. 05/09/16   [provider]  PROAIR HFA 108 (90 Base) MCG/ACT inhaler USE 2 PUFFS 4 TIMES A DAY 05/05/16   [provider]      Allergies    Patient has no known allergies.    Review of Systems   Review of Systems  All other systems reviewed and are negative.   Physical Exam Updated Vital Signs BP (!) 160/108 (BP Location: Right Arm)   Pulse 81   Temp 97.6 F (36.4 C) (Oral)   Resp 20   Ht  (1.753 m)   Wt 76.2 kg   SpO2 100%   BMI 24.81 kg/m  Physical Exam Vitals and nursing note  reviewed.  Constitutional:      General: He is not in acute distress.    Appearance: Normal appearance.  HENT:     Head: Normocephalic and atraumatic.  Eyes:     General:        Right eye: No discharge.        Left eye: No discharge.     Conjunctiva/sclera: Conjunctivae normal.  Cardiovascular:     Comments: Regular rate and rhythm.  S1/S2 are distinct without any evidence of murmur, rubs, or gallops.  Radial pulses are 2+ bilaterally.  Dorsalis pedis pulses are 2+ bilaterally.  No evidence of pedal edema. Pulmonary:     Effort: Pulmonary effort is normal.     Comments: Clear to auscultation bilaterally.  Normal effort.  No respiratory distress.  No evidence of wheezes, rales, or rhonchi heard throughout. Chest:     Comments: There is tenderness to palpation over the anterior and lateral chest wall.  No obvious ecchymosis.  No evidence of flail chest. Abdominal:     General: Abdomen is flat. Bowel sounds are normal. There is no distension.     Tenderness: There is no abdominal tenderness. There is no guarding or rebound.  Musculoskeletal:        General: Normal range of motion.  Cervical back: Neck supple.  Skin:    General: Skin is warm and dry.     Findings: No rash.  Neurological:     General: No focal deficit present.     Mental Status: He is alert.  Psychiatric:        Mood and Affect: Mood normal.        Behavior: Behavior normal.     ED Results / Procedures / Treatments   Labs (all labs ordered are listed, but only abnormal results are displayed) Labs Reviewed - No data to display  EKG None  Radiology DG Lumbar Spine Complete  Result Date: 01/16/2023 CLINICAL DATA:  Fall. Lumbar spine pain with limited range of motion after a fall. EXAM: LUMBAR SPINE - COMPLETE 4+ VIEW COMPARISON:  01/03/2021 FINDINGS: Five lumbar type vertebrae. Normal alignment. No anterior subluxations. Normal alignment of the facet joints. No vertebral compression. No focal bone lesion or  bone destruction. Vascular calcifications. IMPRESSION: No acute bony abnormalities. Electronically Signed   By: Burman Nieves M.D.   On: 01/16/2023 15:22   DG Ribs Unilateral W/Chest Left  Result Date: 01/16/2023 CLINICAL DATA:  Larey Seat onto the left lower posterior ribs. Shortness of breath and limited range of motion. EXAM: LEFT RIBS AND CHEST - 3+ VIEW COMPARISON:  12/05/2022 FINDINGS: Heart size and pulmonary vascularity are normal. Old bilateral rib fracture deformities are similar to prior study. Linear atelectasis or scarring in the lung bases is also similar. Blunting of the left costophrenic angle may be due to fluid or thickened pleura. Mediastinal contours are intact. No pneumothorax. Left ribs demonstrate multiple old healed fractures. In addition, there is an acute mildly displaced fracture of the anterior left fifth and sixth ribs. Soft tissues are unremarkable. IMPRESSION: 1. No evidence of active pulmonary disease. Atelectasis or scarring in the lung bases similar to prior study. 2. Acute fractures of the left anterior fifth and sixth ribs. 3. Multiple old rib fractures bilaterally. Electronically Signed   By: Burman Nieves M.D.   On: 01/16/2023 15:14    Procedures Procedures    Medications Ordered in ED Medications  lidocaine (LIDODERM) 5 % 1 patch (1 patch Transdermal Patch Applied 01/16/23 1629)  oxyCODONE-acetaminophen (PERCOCET/ROXICET) 5-325 MG per tablet 2 tablet (2 tablets Oral Given 01/16/23 1628)    ED Course/ Medical Decision Making/ A&P Clinical Course as of 01/16/23 1705  Thu Jan 16, 2023  1631 DG Lumbar Spine Complete I personally ordered interpreted the study and do not see any evidence of lumbar fracture.  I do agree with radiologist interpretation. [CF]  1631 DG Ribs Unilateral W/Chest Left I personally ordered and interpreted the study and do see evidence of some rib fractures.  I do agree with radiologist interpretation. [CF]    Clinical Course User  Index [CF] Teressa Lower, PA-C   {   Click here for ABCD2, HEART and other calculators  Medical Decision Making David Chase is a 67 y.o. male patient who presents to the emergency department today for further evaluation of a mechanical trip and fall after losing his balance and subsequent anterior and left lateral chest wall pain.  Given the mechanism of injury and when to get x-rays to further assess for possible rib fractures.  Patient does appear to be splinting taking shallower breaths secondary to pain.  Breath sounds are equal there is no evidence of flail chest.  Apart from hypertension which could be from pain the rest vital signs are normal.  Patient's pain improved  with pain medication.  He will follow-up with his primary care doctor.  If symptoms rapidly given.  Pain medication ordered in addition to lidocaine patches.  Strict return precautions were discussed.  He is safe for discharge.  Amount and/or Complexity of Data Reviewed Radiology: ordered. Decision-making details documented in ED Course.  Risk Prescription drug management.    Final Clinical Impression(s) / ED Diagnoses Final diagnoses:  Fall, initial encounter  Closed fracture of multiple ribs of left side, initial encounter    Rx / DC Orders ED Discharge Orders          Ordered    oxyCODONE-acetaminophen (PERCOCET/ROXICET) 5-325 MG tablet  Every 6 hours PRN        01/16/23 1642    lidocaine (LIDODERM) 5 %  Every 24 hours        01/16/23 1642              Honor Loh [place], [place] 01/16/23 1705    Maia Plan, MD 01/17/23 [phone]

## 2023-01-16 NOTE — ED Notes (Signed)
Instructed to do 10 breaths with IS q2hr w/a.  1500-1700 with good breath hold.  01/16/23 1621  Incentive Spirometry Tx  Level of Service Assisted by RCP  Frequency q2hr W/A  Treatment Tolerance Tolerated well  IS Goal (mL) (RN or RT) 1500 mL  IS - Achieved (mL) (RN, NT, or RT) 1750 mL
